# Patient Record
Sex: Male | Born: 1990 | Race: White | Hispanic: No | Marital: Single | Smoking: Never smoker
Health system: Southern US, Community
[De-identification: ages and names within clinical notes are randomized; demographics above are authoritative.]

---

## 2021-12-16 ENCOUNTER — Other Ambulatory Visit: Payer: Self-pay

## 2021-12-16 ENCOUNTER — Emergency Department (HOSPITAL_BASED_OUTPATIENT_CLINIC_OR_DEPARTMENT_OTHER)
Admission: EM | Admit: 2021-12-16 | Discharge: 2021-12-16 | Disposition: A | Discharge status: Home / Self Care | Payer: Managed Care, Other (non HMO) | Attending: Emergency Medicine | Admitting: Emergency Medicine

## 2021-12-16 ENCOUNTER — Encounter (HOSPITAL_BASED_OUTPATIENT_CLINIC_OR_DEPARTMENT_OTHER): Payer: Self-pay | Admitting: *Deleted

## 2021-12-16 ENCOUNTER — Emergency Department (HOSPITAL_BASED_OUTPATIENT_CLINIC_OR_DEPARTMENT_OTHER): Payer: Managed Care, Other (non HMO)

## 2021-12-16 ENCOUNTER — Other Ambulatory Visit (HOSPITAL_BASED_OUTPATIENT_CLINIC_OR_DEPARTMENT_OTHER): Payer: Self-pay

## 2021-12-16 DIAGNOSIS — R109 Unspecified abdominal pain: Secondary | ICD-10-CM | POA: Diagnosis present

## 2021-12-16 DIAGNOSIS — N201 Calculus of ureter: Secondary | ICD-10-CM | POA: Insufficient documentation

## 2021-12-16 LAB — URINALYSIS, COMPLETE (UACMP) WITH MICROSCOPIC
Bilirubin Urine: NEGATIVE
Glucose, UA: NEGATIVE mg/dL
Ketones, ur: NEGATIVE mg/dL
Leukocytes,Ua: NEGATIVE
Nitrite: NEGATIVE
Protein, ur: NEGATIVE mg/dL
Specific Gravity, Urine: 1.015 (ref 1.005–1.030)
pH: 7 (ref 5.0–8.0)

## 2021-12-16 LAB — CBC WITH DIFFERENTIAL/PLATELET
Abs Immature Granulocytes: 0.01 10*3/uL (ref 0.00–0.07)
Basophils Absolute: 0.1 10*3/uL (ref 0.0–0.1)
Basophils Relative: 1 %
Eosinophils Absolute: 0.1 10*3/uL (ref 0.0–0.5)
Eosinophils Relative: 1 %
HCT: 48.3 % (ref 39.0–52.0)
Hemoglobin: 16.9 g/dL (ref 13.0–17.0)
Immature Granulocytes: 0 %
Lymphocytes Relative: 31 %
Lymphs Abs: 1.8 10*3/uL (ref 0.7–4.0)
MCH: 28.8 pg (ref 26.0–34.0)
MCHC: 35 g/dL (ref 30.0–36.0)
MCV: 82.4 fL (ref 80.0–100.0)
Monocytes Absolute: 0.7 10*3/uL (ref 0.1–1.0)
Monocytes Relative: 12 %
Neutro Abs: 3.3 10*3/uL (ref 1.7–7.7)
Neutrophils Relative %: 55 %
Platelets: 279 10*3/uL (ref 150–400)
RBC: 5.86 MIL/uL — ABNORMAL HIGH (ref 4.22–5.81)
RDW: 12.6 % (ref 11.5–15.5)
WBC: 5.9 10*3/uL (ref 4.0–10.5)
nRBC: 0 % (ref 0.0–0.2)

## 2021-12-16 LAB — BASIC METABOLIC PANEL
Anion gap: 11 (ref 5–15)
BUN: 18 mg/dL (ref 6–20)
CO2: 24 mmol/L (ref 22–32)
Calcium: 10.1 mg/dL (ref 8.9–10.3)
Chloride: 106 mmol/L (ref 98–111)
Creatinine, Ser: 1.08 mg/dL (ref 0.61–1.24)
GFR, Estimated: >60 mL/min (ref >60)
Glucose, Bld: 121 mg/dL — ABNORMAL HIGH (ref 70–99)
Potassium: 3.6 mmol/L (ref 3.5–5.1)
Sodium: 141 mmol/L (ref 135–145)

## 2021-12-16 MED ORDER — ONDANSETRON 4 MG PO TBDP
4.0000 mg | ORAL_TABLET | Freq: Three times a day (TID) | ORAL | 0 refills | Status: AC | PRN
  Filled 2021-12-16: qty 9, 3d supply, fill #0

## 2021-12-16 MED ORDER — MORPHINE SULFATE (PF) 4 MG/ML IV SOLN
4.0000 mg | Freq: Once | INTRAVENOUS | Status: AC
  Administered 2021-12-16: 4 mg via INTRAVENOUS
  Filled 2021-12-16: qty 1

## 2021-12-16 MED ORDER — ONDANSETRON HCL 4 MG/2ML IJ SOLN
4.0000 mg | Freq: Once | INTRAMUSCULAR | Status: AC
  Administered 2021-12-16: 4 mg via INTRAVENOUS
  Filled 2021-12-16: qty 2

## 2021-12-16 MED ORDER — HYDROCODONE-ACETAMINOPHEN 5-325 MG PO TABS
1.0000 | ORAL_TABLET | ORAL | 0 refills | Status: AC | PRN
  Filled 2021-12-16: qty 10, 2d supply, fill #0

## 2021-12-16 MED ORDER — SODIUM CHLORIDE 0.9 % IV BOLUS
1000.0000 mL | Freq: Once | INTRAVENOUS | Status: AC
  Administered 2021-12-16: 1000 mL via INTRAVENOUS

## 2021-12-16 MED ORDER — KETOROLAC TROMETHAMINE 15 MG/ML IJ SOLN
15.0000 mg | Freq: Once | INTRAMUSCULAR | Status: AC
  Administered 2021-12-16: 15 mg via INTRAVENOUS
  Filled 2021-12-16: qty 1

## 2021-12-16 NOTE — ED Notes (Signed)
AVS provided to client, also provided a strainer and instructed on use, also provided pt teaching on the two Rx's electronically sent to the pharmacy here at [facility]. Opportunity for questions provided prior to DC to home. Has a co-worker that will drive pt back to his hotel.  ?

## 2021-12-16 NOTE — ED Provider Notes (Signed)
?[place] EMERGENCY DEPARTMENT ?Provider Note ? ? ?CSN: [id number] ?Arrival date & time: 12/16/21  [id number] ? ?  ? ?History ? ?Chief Complaint  ?Patient presents with  ? Flank Pain  ? ? ?Edward Avery is a 31 y.o. male. ? ?31 year old male with no significant past medical history presents with complaint of pain in his left flank that radiates to his left groin area.  Patient states that he had a dull ache yesterday as he was [place] to the airport however pain has been constant and severe today, waxes and wanes in severity from a dull to sharp stabbing pain.  Nothing seems to make pain better or worse.  Associated with chills and nausea.  Denies hematuria, changes in bowel or bladder habits, fevers, vomiting.  States that he has had similar pain in the past that resolved and was never evaluated for it.  No known history of kidney stones.  Has drug or alcohol use. ? ? ?  ? ?Home Medications ?Prior to Admission medications   ?Medication Sig Start Date End Date Taking? Authorizing Provider  ?HYDROcodone-acetaminophen (NORCO/VICODIN) 5-325 MG tablet Take 1 tablet by mouth every 4 (four) hours as needed. 12/16/21  Yes Tacy Learn, PA-C  ?ondansetron (ZOFRAN-ODT) 4 MG disintegrating tablet Dissolve 1 tablet (4 mg total) by mouth every 8 (eight) hours as needed for nausea or vomiting. 12/16/21  Yes Tacy Learn, PA-C  ?   ? ?Allergies    ?Patient has no known allergies.   ? ?Review of Systems   ?Review of Systems ?Negative except as per HPI ?Physical Exam ?Updated Vital Signs ?BP (!) 123/101 (BP Location: Right Arm)   Pulse (!) 103   Temp 97.9 ?F (36.6 ?C) (Oral)   Resp 16   Ht 5\' 8"  (1.727 m)   Wt 88.2 kg   SpO2 97%   BMI 29.56 kg/m?  ?Physical Exam ?Vitals and nursing note reviewed.  ?Constitutional:   ?   General: He is not in acute distress. ?   Appearance: He is well-developed. He is not diaphoretic.  ?HENT:  ?   Head: Normocephalic and atraumatic.  ?Cardiovascular:  ?   Rate and Rhythm: Regular  rhythm. Tachycardia present.  ?   Pulses: Normal pulses.  ?   Heart sounds: Normal heart sounds.  ?Pulmonary:  ?   Effort: Pulmonary effort is normal.  ?   Breath sounds: Normal breath sounds.  ?Abdominal:  ?   Palpations: Abdomen is soft.  ?   Tenderness: There is no abdominal tenderness. There is no right CVA tenderness or left CVA tenderness.  ?Musculoskeletal:     ?   General: No tenderness.  ?   Right lower leg: No edema.  ?   Left lower leg: No edema.  ?Skin: ?   General: Skin is warm and dry.  ?   Findings: No erythema or rash.  ?Neurological:  ?   Mental Status: He is alert and oriented to person, place, and time.  ?Psychiatric:     ?   Behavior: Behavior normal.  ? ? ?ED Results / Procedures / Treatments   ?Labs ?(all labs ordered are listed, but only abnormal results are displayed) ?Labs Reviewed  ?URINALYSIS, COMPLETE (UACMP) WITH MICROSCOPIC - Abnormal; Notable for the following components:  ?    Result Value  ? Hgb urine dipstick LARGE (*)   ? Bacteria, UA RARE (*)   ? All other components within normal limits  ?CBC WITH DIFFERENTIAL/PLATELET - Abnormal; Notable  for the following components:  ? RBC 5.86 (*)   ? All other components within normal limits  ?BASIC METABOLIC PANEL - Abnormal; Notable for the following components:  ? Glucose, Bld 121 (*)   ? All other components within normal limits  ? ? ?EKG ?None ? ?Radiology ?CT Renal Stone Study ? ?Result Date: 12/16/2021 ?CLINICAL DATA:  Left flank pain and groin pain concern for renal stone. EXAM: CT ABDOMEN AND PELVIS WITHOUT CONTRAST TECHNIQUE: Multidetector CT imaging of the abdomen and pelvis was performed following the standard protocol without IV contrast. RADIATION DOSE REDUCTION: This exam was performed according to the departmental dose-optimization program which includes automated exposure control, adjustment of the mA and/or kV according to patient size and/or use of iterative reconstruction technique. COMPARISON:  None. FINDINGS: Lower chest:  No acute abnormality. Hepatobiliary: Hypodense subcentimeter lesion in the right lobe of the liver on image 15/2 is technically too small to accurately characterize but statistically likely to reflect a cyst. Gallbladder is unremarkable. No biliary ductal dilation. Pancreas: No pancreatic ductal dilation or evidence of acute inflammation. Spleen: No splenomegaly or focal splenic lesion. Adrenals/Urinary Tract: Bilateral adrenal glands appear normal. Mild hydroureteronephrosis to the level of a 2-3 mm stone in the proximal ureter at the L3 vertebral body level. Additional punctate nonobstructive left lower pole renal stone. Right kidney is unremarkable without hydronephrosis or nephrolithiasis. Urinary bladder is unremarkable for degree of distension. Stomach/Bowel: No enteric contrast was administered. Stomach is unremarkable for degree of distension. No pathologic dilation of small or large bowel. The appendix and terminal ileum appear normal. No evidence of acute bowel inflammation. Vascular/Lymphatic: Normal caliber abdominal aorta. No pathologically enlarged abdominal or pelvic lymph nodes. Reproductive: Prostate is unremarkable. Other: No significant abdominopelvic free fluid. Musculoskeletal: No acute or significant osseous findings. IMPRESSION: 1. Mild left hydroureteronephrosis to the level of a 2-3 mm stone in the proximal ureter at the L3 vertebral body level. 2. Additional punctate nonobstructive left lower pole renal stone. Electronically Signed   By: Maudry Mayhew M.D.   On: 12/16/2021 10:43   ? ?Procedures ?Procedures  ? ? ?Medications Ordered in ED ?Medications  ?ketorolac (TORADOL) 15 MG/ML injection 15 mg (has no administration in time range)  ?sodium chloride 0.9 % bolus 1,000 mL (0 mLs Intravenous Stopped 12/16/21 1116)  ?ondansetron [facility]) injection 4 mg (4 mg Intravenous Given 12/16/21 0950)  ?morphine (PF) 4 MG/ML injection 4 mg (4 mg Intravenous Given 12/16/21 0952)  ? ? ?ED Course/ Medical  Decision Making/ A&P ?  ?                        ?Medical Decision Making ?Amount and/or Complexity of Data Reviewed ?Labs: ordered. ?Radiology: ordered. ? ?Risk ?Prescription drug management. ? ? ?This patient presents to the ED for concern of left flank pain onset this morning without history of kidney stones, this involves an extensive number of treatment options, and is a complaint that carries with it a high risk of complications and morbidity.  The differential diagnosis includes but not limited to ureterolithiasis, pyelonephritis, torsion, hernia, colitis ? ? ?Co morbidities that complicate the patient evaluation ? ?No past medical history ? ? ?Additional history obtained: ? ?External records from outside source obtained and reviewed including abdominal ultrasound from 10/21/2020, no renal stones at that time ? ? ?Lab Tests: ? ?I Ordered, and personally interpreted labs.  The pertinent results include: Urinalysis with large hemoglobin, no evidence of infection.  BMP with  normal renal function, CBC with normal white count ? ? ?Imaging Studies ordered: ? ?I ordered imaging studies including CT  ?As read by radiology, 3 mm mid left ureteral stone ?I agree with the radiologist interpretation ? ? ? ?Problem List / ED Course / Critical interventions / Medication management ? ?31 year old male brought in by EMS with left flank pain as above.  On exam, no CVA tenderness, abdomen soft nontender, denies testicular pain. ?Found to have mid ureteral stone on CT without obstruction, labs reassuring with normal white count, renal function, no evidence of UTI.  Pain is controlled.  Patient is discharged with plan to strain urine, follow-up with urology either locally or when he returns back home with return to ER precautions specifically for fever, pain or vomiting not controlled with medications. ?I ordered medication including morphine, Zofran, Toradol for pain, nausea ?Reevaluation of the patient after these medicines  showed that the patient resolved ?I have reviewed the patients home medicines and have made adjustments as needed ? ? ?Social Determinants of Health: ? ?Here from [place], no local PCP care ? ? ? ? ? ? ? ? ? ?Final Cl

## 2021-12-16 NOTE — ED Triage Notes (Signed)
Onset of left flank pain yesterday, this am worse, radiates to groin area, states no difficulty with urination, no hx of kidney stones. Rates pain from 4-9 on 0-10 scale. Denies nausea ?

## 2021-12-16 NOTE — Discharge Instructions (Addendum)
You were given Toradol just prior to discharge.  This is a NSAID type pain medication.  Continue with Motrin 600 mg every 8 hours starting after 8 PM tonight. ?Prescription for Norco for pain not controlled with Motrin.  Take as prescribed, do not drive operate machinery while taking this medication. ?Scription for Zofran for nausea and vomiting. ?Strain your urine.  If you happen to catch the stone, you can take it to urology and follow-up.  Given information for local urology follow-up, consider contacting urology back home for follow-up upon arrival back home. ?Return to the emergency room for fever, pain or vomiting not controlled with medications. ?

## 2023-03-11 IMAGING — CT CT RENAL STONE PROTOCOL
2 of 4 series · 16 of 46 positions shown, 18 images · non-contrast
Comparison: None.

CLINICAL DATA: Left flank pain and groin pain concern for renal
stone.



[Series 2: axial st · axial · 0.89mm/px · z∈[-515,-55]mm · 13 of 101 slices shown, 15 images]
[im 5/101  soft-tissue]
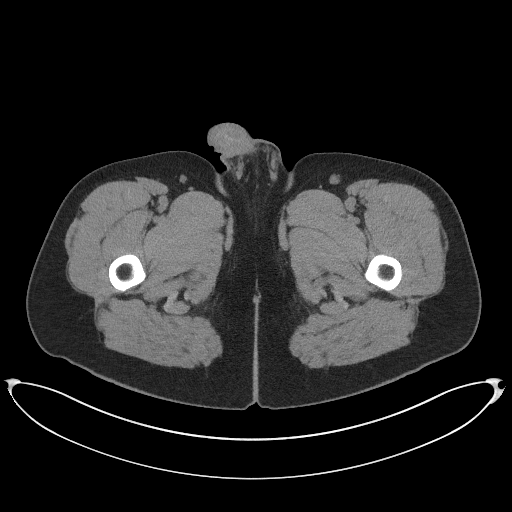
[im 5/101  bone]
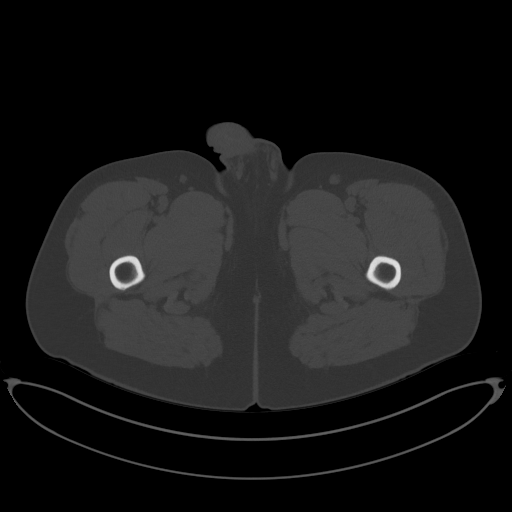
[im 13/101  soft-tissue]
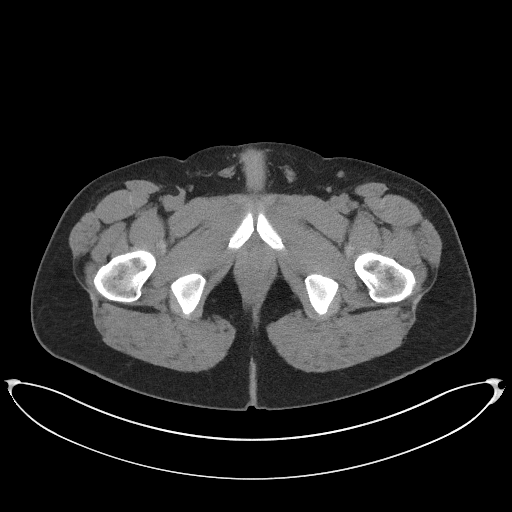
[im 21/101  soft-tissue]
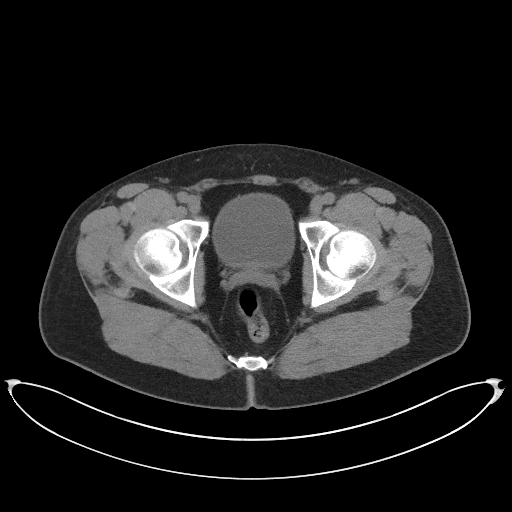
[im 29/101  soft-tissue]
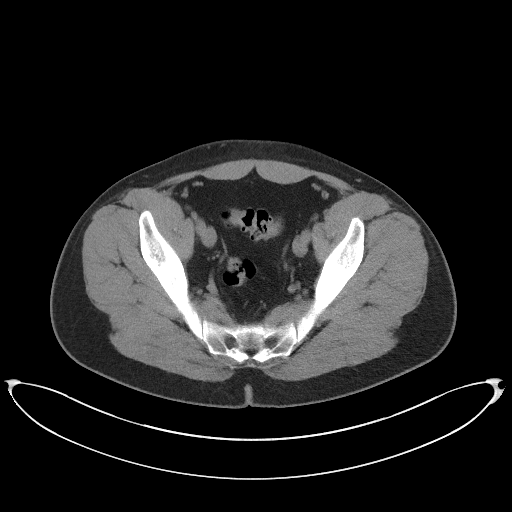
[im 37/101  soft-tissue]
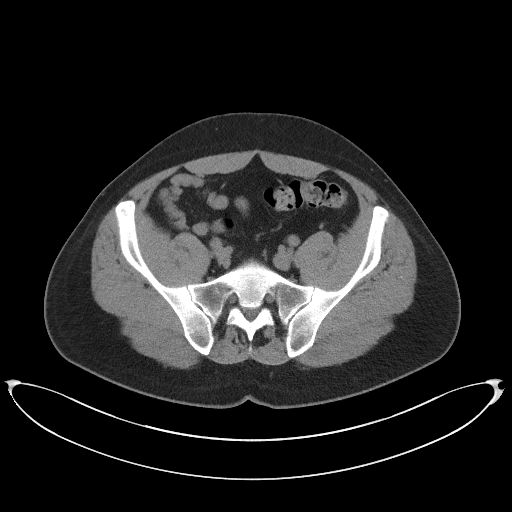
[im 45/101  soft-tissue]
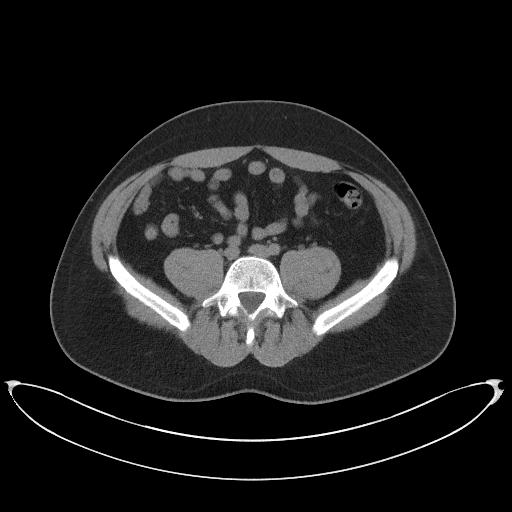
[im 53/101  soft-tissue]
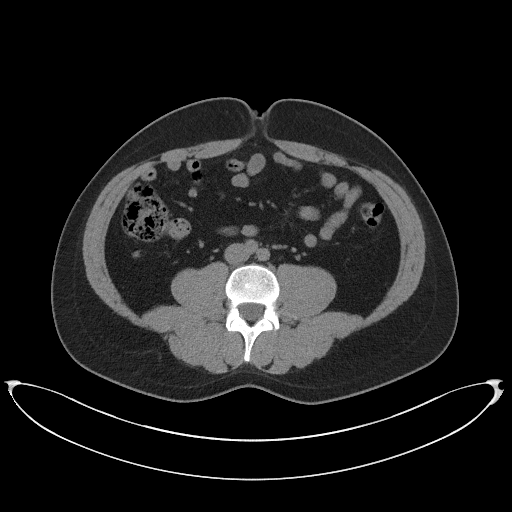
[im 57/101  soft-tissue]
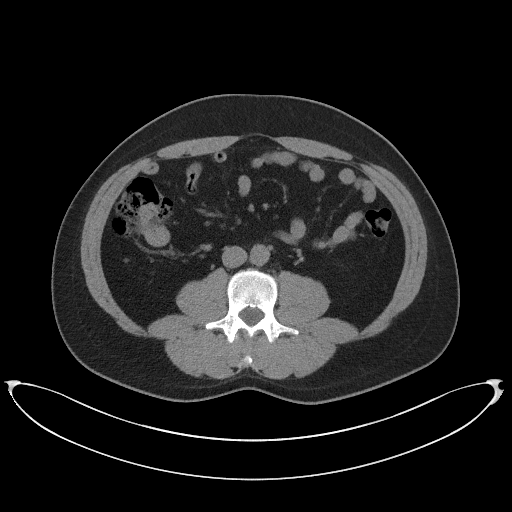
[im 65/101  soft-tissue]
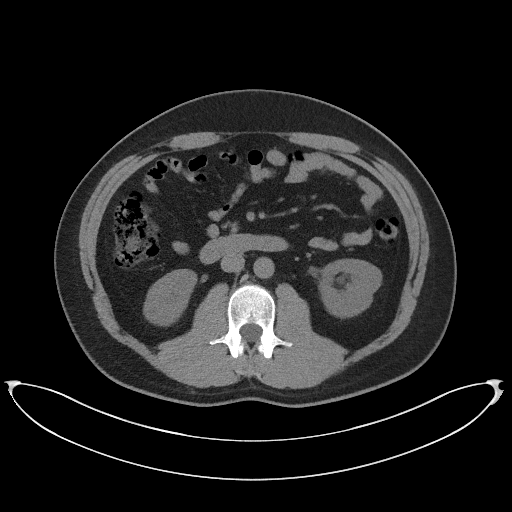
[im 65/101  bone]
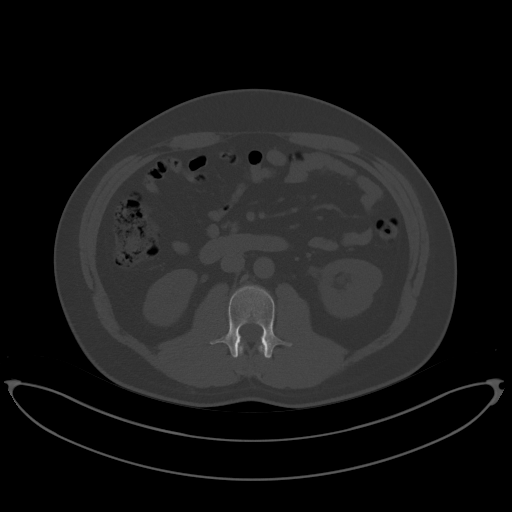
[im 73/101  soft-tissue]
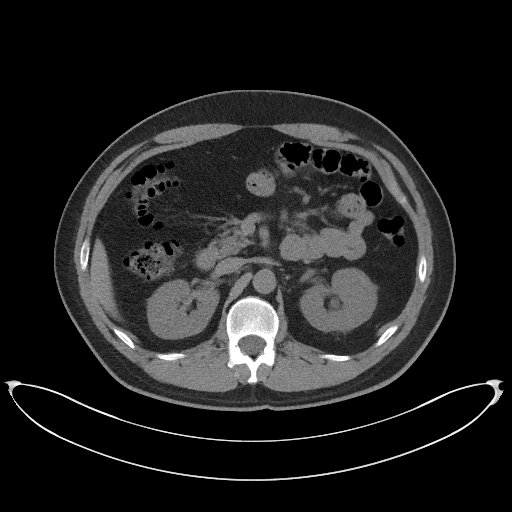
[im 81/101  soft-tissue]
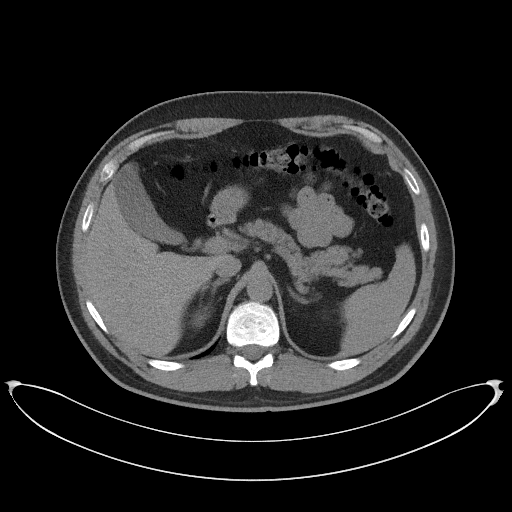
[im 89/101  soft-tissue]
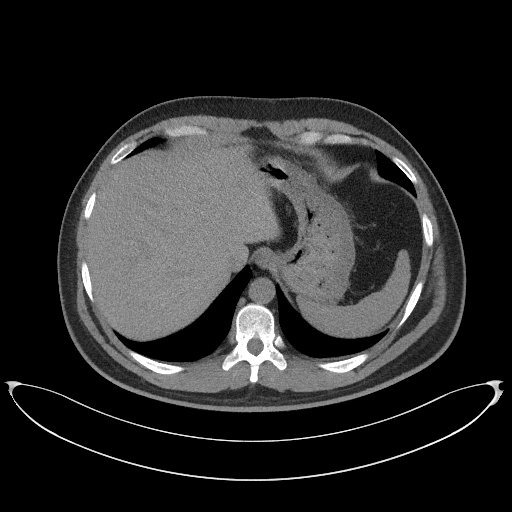
[im 97/101  soft-tissue]
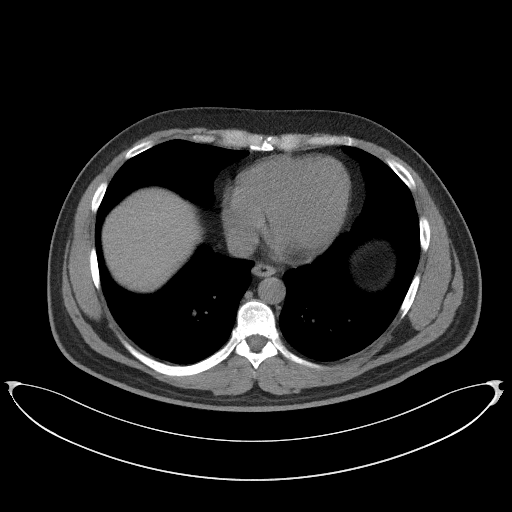

[Series 5: coronal st · coronal · 0.86mm/px · 3 of 94 slices shown]
[im 32/94  soft-tissue]
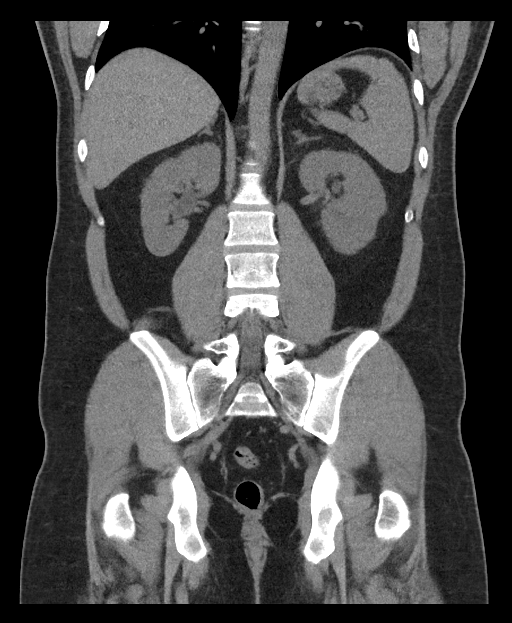
[im 42/94  soft-tissue]
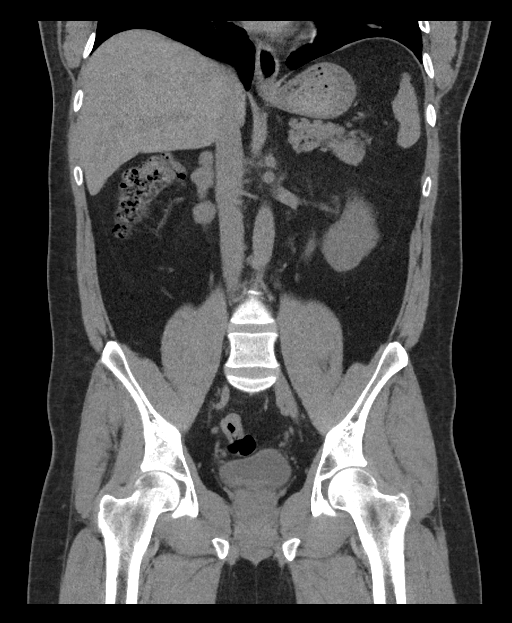
[im 52/94  soft-tissue]
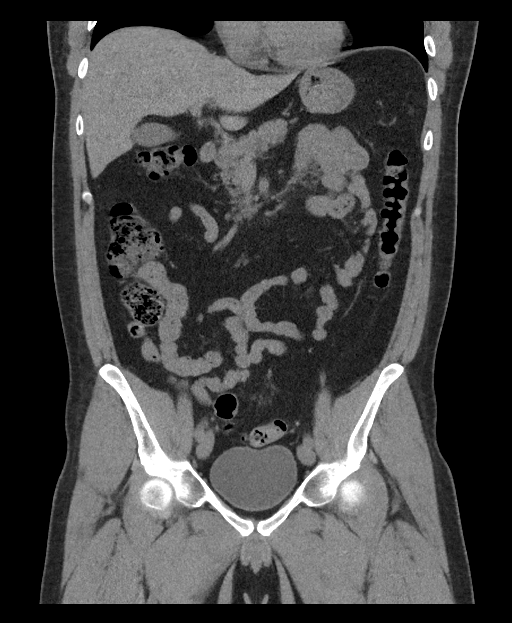

[16 of 46 positions shown; findings below may reference images not displayed]

FINDINGS: Lower chest: No acute abnormality.

Hepatobiliary: Hypodense subcentimeter lesion in the right lobe of
the liver on image [DATE] is technically too small to accurately
characterize but statistically likely to reflect a cyst. Gallbladder
is unremarkable. No biliary ductal dilation.

Pancreas: No pancreatic ductal dilation or evidence of acute
inflammation.

Spleen: No splenomegaly or focal splenic lesion.

Adrenals/Urinary Tract: Bilateral adrenal glands appear normal.

Mild hydroureteronephrosis to the level of a 2-3 mm stone in the
proximal ureter at the L3 vertebral body level. Additional punctate
nonobstructive left lower pole renal stone. Right kidney is
unremarkable without hydronephrosis or nephrolithiasis. Urinary
bladder is unremarkable for degree of distension.

Stomach/Bowel: No enteric contrast was administered. Stomach is
unremarkable for degree of distension. No pathologic dilation of
small or large bowel. The appendix and terminal ileum appear normal.
No evidence of acute bowel inflammation.

Vascular/Lymphatic: Normal caliber abdominal aorta. No
pathologically enlarged abdominal or pelvic lymph nodes.

Reproductive: Prostate is unremarkable.

Other: No significant abdominopelvic free fluid.

Musculoskeletal: No acute or significant osseous findings.
IMPRESSION: 1. Mild left hydroureteronephrosis to the level of a 2-3 mm stone in
the proximal ureter at the L3 vertebral body level.
2. Additional punctate nonobstructive left lower pole renal stone.
